# Patient Record
Sex: Female | Born: 1980 | Race: Black or African American | Hispanic: No | Marital: Married | Smoking: Former smoker
Health system: Southern US, Community
[De-identification: ages and names within clinical notes are randomized; demographics above are authoritative.]

## PROBLEM LIST (undated history)

## (undated) DIAGNOSIS — R569 Unspecified convulsions: Secondary | ICD-10-CM

---

## 2006-05-05 ENCOUNTER — Emergency Department (HOSPITAL_COMMUNITY): Admission: EM | Admit: 2006-05-05 | Discharge: 2006-05-05 | Payer: Self-pay | Admitting: Emergency Medicine

## 2007-09-13 IMAGING — CT CT HEAD W/O CM
1 of 2 series · 13 of 30 positions shown, 17 images · non-contrast
Comparison: none

CLINICAL DATA: Seizure.  Headache.  Vomiting.  
 HEAD CT WITHOUT CONTRAST ([DATE] HOURS):
TECHNIQUE: Contiguous axial images were obtained from the base of the skull through the vertex according to standard protocol without contrast.

[Series 2: brain · axial · 0.47mm/px · z∈[+112,+232]mm · 13 of 28 slices shown, 17 images]
[im 2/28  brain]
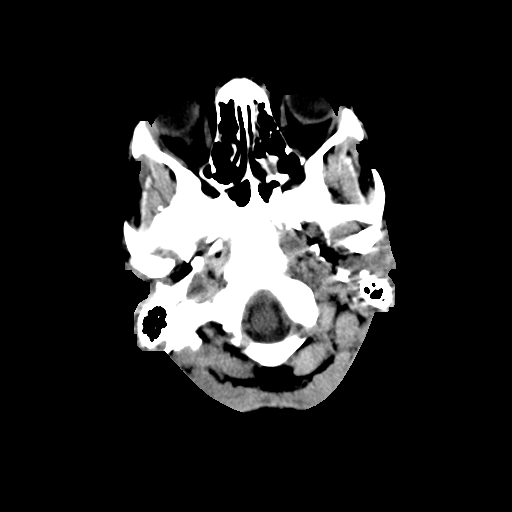
[im 2/28  bone]
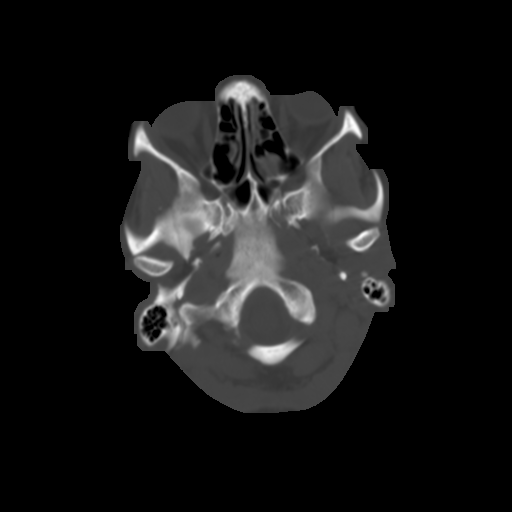
[im 4/28  brain]
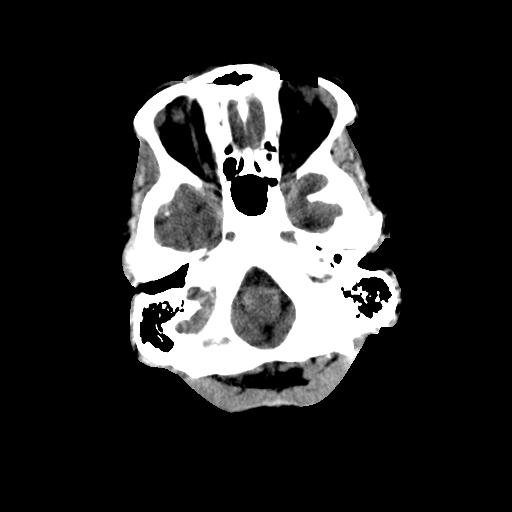
[im 6/28  brain]
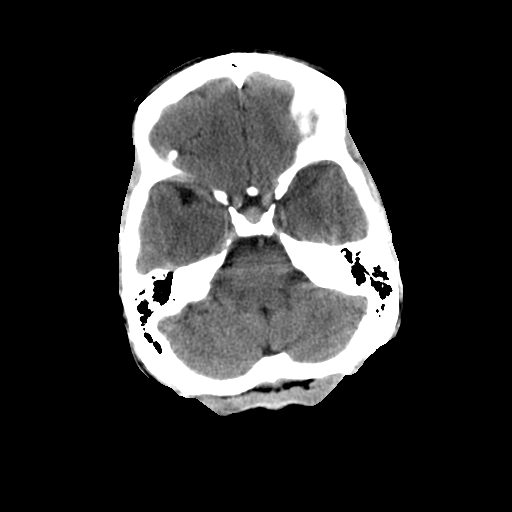
[im 8/28  brain]
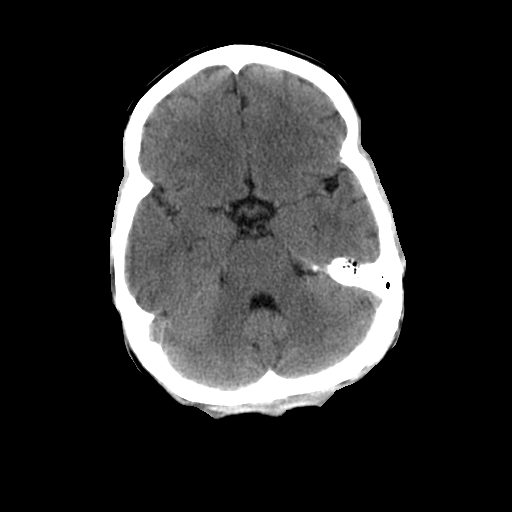
[im 10/28  brain]
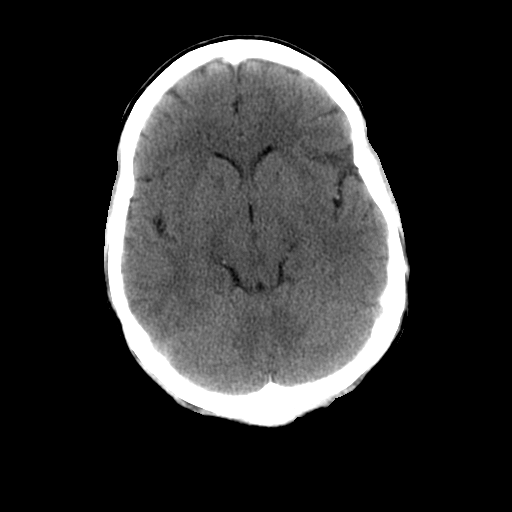
[im 10/28  bone]
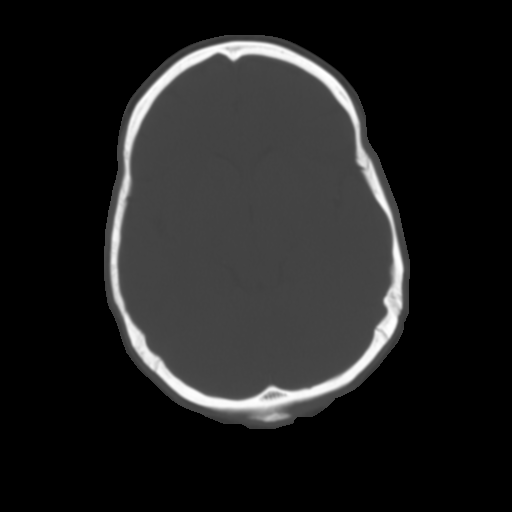
[im 12/28  brain]
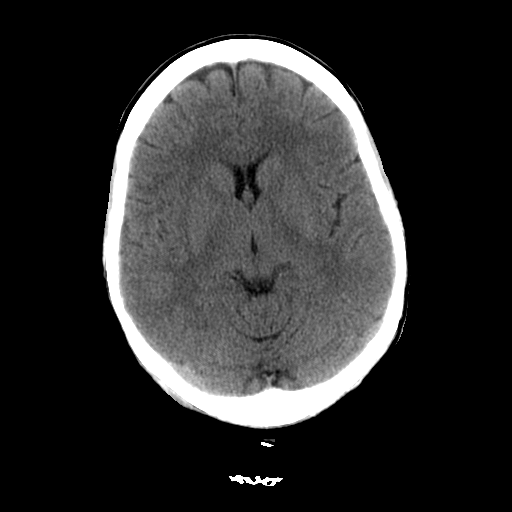
[im 14/28  brain]
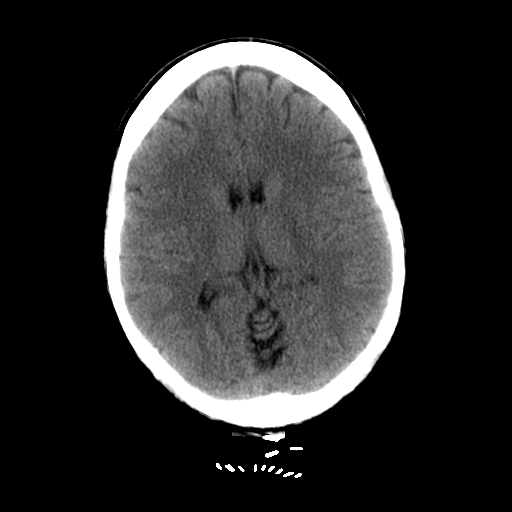
[im 16/28  brain]
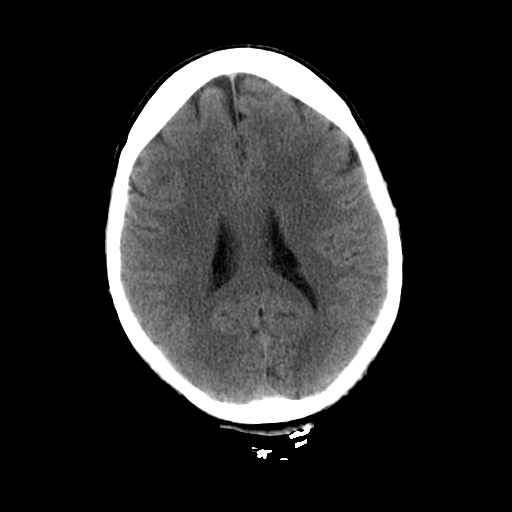
[im 18/28  brain]
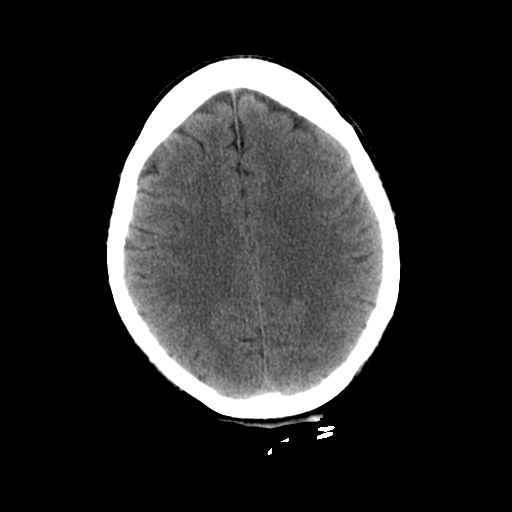
[im 18/28  bone]
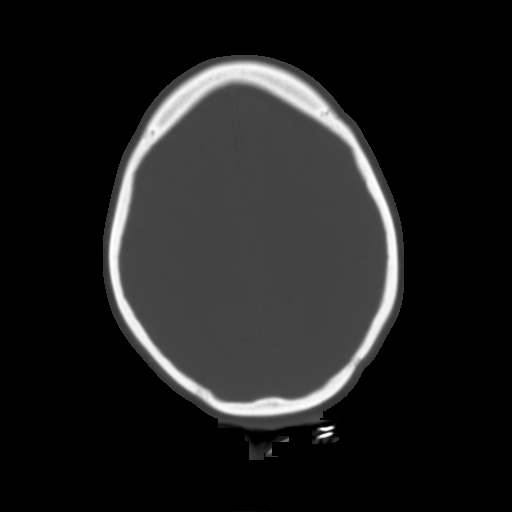
[im 20/28  brain]
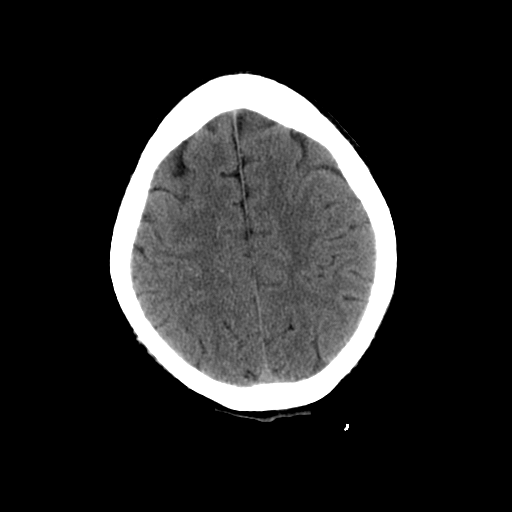
[im 22/28  brain]
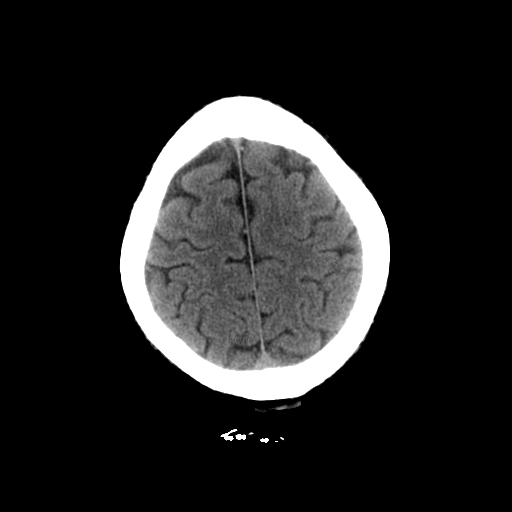
[im 24/28  brain]
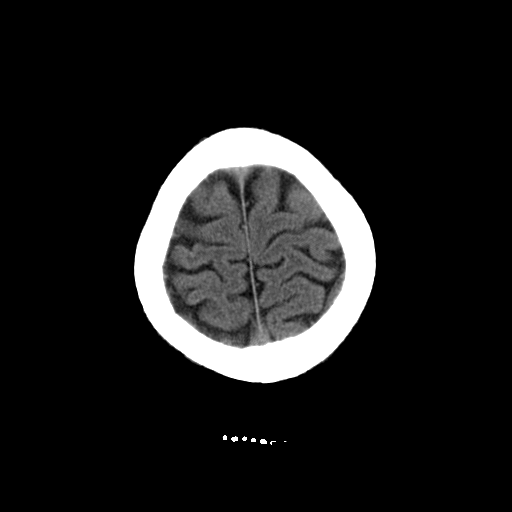
[im 26/28  brain]
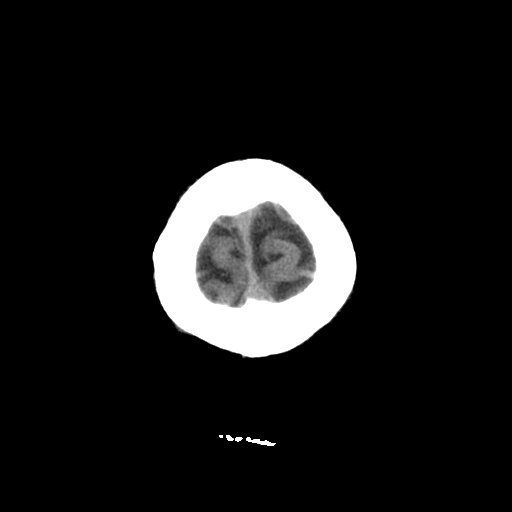
[im 26/28  bone]
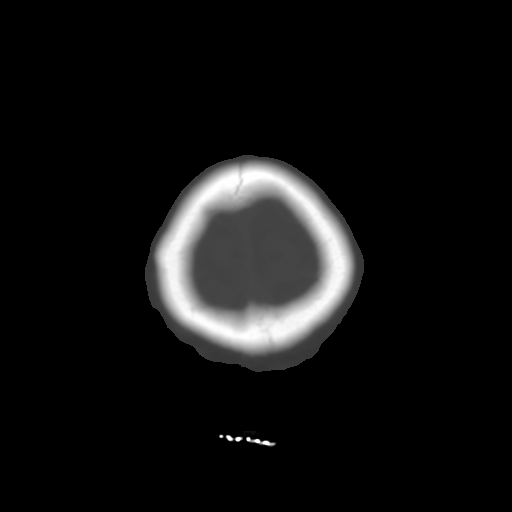

[13 of 30 positions shown; findings below may reference images not displayed]

FINDINGS: There is no mass effect, midline shift, or acute intracranial hemorrhage.  The brain parenchyma, ventricular system, and extraaxial space are within normal limits.  The skull is intact.  Opacification of the left posterior ethmoid air cell is noted.  Mastoid air cells are clear.
IMPRESSION: Opacification of the left posterior ethmoid air cell.  Otherwise no acute intracranial pathology.

## 2017-07-12 ENCOUNTER — Encounter (HOSPITAL_COMMUNITY): Payer: Self-pay | Admitting: *Deleted

## 2017-07-12 ENCOUNTER — Emergency Department (HOSPITAL_COMMUNITY)
Admission: EM | Admit: 2017-07-12 | Discharge: 2017-07-13 | Disposition: A | Discharge status: Home / Self Care | Payer: BLUE CROSS/BLUE SHIELD | Attending: Emergency Medicine | Admitting: Emergency Medicine

## 2017-07-12 DIAGNOSIS — I10 Essential (primary) hypertension: Secondary | ICD-10-CM

## 2017-07-12 DIAGNOSIS — Z79899 Other long term (current) drug therapy: Secondary | ICD-10-CM | POA: Diagnosis not present

## 2017-07-12 DIAGNOSIS — R51 Headache: Secondary | ICD-10-CM | POA: Insufficient documentation

## 2017-07-12 DIAGNOSIS — R030 Elevated blood-pressure reading, without diagnosis of hypertension: Secondary | ICD-10-CM | POA: Diagnosis present

## 2017-07-12 DIAGNOSIS — Z87891 Personal history of nicotine dependence: Secondary | ICD-10-CM | POA: Diagnosis not present

## 2017-07-12 HISTORY — DX: Unspecified convulsions: R56.9

## 2017-07-12 LAB — CBC WITH DIFFERENTIAL/PLATELET
Basophils Absolute: 0 10*3/uL (ref 0.0–0.1)
Basophils Relative: 0 %
EOS ABS: 0.2 10*3/uL (ref 0.0–0.7)
EOS PCT: 2 %
HCT: 46.6 % — ABNORMAL HIGH (ref 36.0–46.0)
Hemoglobin: 15.6 g/dL — ABNORMAL HIGH (ref 12.0–15.0)
LYMPHS ABS: 4.4 10*3/uL — AB (ref 0.7–4.0)
LYMPHS PCT: 39 %
MCH: 34 pg (ref 26.0–34.0)
MCHC: 33.5 g/dL (ref 30.0–36.0)
MCV: 101.5 fL — AB (ref 78.0–100.0)
MONO ABS: 0.7 10*3/uL (ref 0.1–1.0)
MONOS PCT: 6 %
Neutro Abs: 6 10*3/uL (ref 1.7–7.7)
Neutrophils Relative %: 53 %
PLATELETS: 332 10*3/uL (ref 150–400)
RBC: 4.59 MIL/uL (ref 3.87–5.11)
RDW: 12.8 % (ref 11.5–15.5)
WBC: 11.4 10*3/uL — ABNORMAL HIGH (ref 4.0–10.5)

## 2017-07-12 LAB — I-STAT TROPONIN, ED: TROPONIN I, POC: 0.01 ng/mL (ref 0.00–0.08)

## 2017-07-12 LAB — I-STAT BETA HCG BLOOD, ED (MC, WL, AP ONLY): I-stat hCG, quantitative: <5 m[IU]/mL (ref <5)

## 2017-07-12 NOTE — ED Provider Notes (Signed)
[facility] EMERGENCY DEPARTMENT Provider Note   CSN: [id number] Arrival date & time: 07/12/17  1819     History   Chief Complaint Chief Complaint  Patient presents with  . Hypertension    HPI Katie Adams is a 36 y.o. female.  HPI   Katie Adams is a 36 y.o. female, with a history of seizures, presenting to the ED with concern for high blood pressure. 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 States she has had high blood pressure readings in the past, has gone to urgent care in the past for this, and was prescribed norvasc. Does not have a PCP. Currently complains of a left frontal headache, he feels like a pressure, moderate, nonradiating, consistent with her previous headaches.  Has not taken any medications for her headache.  Denies dizziness, fever/chills, falls/trauma, neuro deficits, vision abnormalities, chest pain, shortness of breath, or any other complaints.      Past Medical History:  Diagnosis Date  . Seizures (HCC)     There are no active problems to display for this patient.   History reviewed. No pertinent surgical history.  OB History    No data available       Home Medications    Prior to Admission medications   Medication Sig Start Date End Date Taking? Authorizing Provider  aspirin-acetaminophen-caffeine (EXCEDRIN MIGRAINE) [phone] MG tablet Take 2 tablets by mouth every 6 (six) hours as needed for headache.   Yes [provider]  fexofenadine (ALLEGRA) 180 MG tablet Take 180 mg by mouth daily.   Yes [provider]  lamoTRIgine (LAMICTAL) 100 MG tablet Take 100 mg by mouth 2 (two) times daily. 01/28/17  Yes [provider]    Family History No family history on file.  Social History Social History  Substance Use Topics  . Smoking status: Former Games developer  . Smokeless tobacco: Never Used  . Alcohol use Yes     Comment: social     Allergies     Amoxicillin and Keppra [levetiracetam]   Review of Systems Review of Systems  Constitutional: Negative for chills, diaphoresis and fever.  Respiratory: Negative for shortness of breath.   Cardiovascular: Negative for chest pain.  Gastrointestinal: Negative for abdominal pain, nausea and vomiting.  Neurological: Positive for headaches. Negative for dizziness, syncope, weakness, light-headedness and numbness.  All other systems reviewed and are negative.   Physical Exam Updated Vital Signs BP (!) 122/107   Pulse 61   Temp 98.6 F (37 C) (Oral)   Resp 16   Ht 5\' 4"  (1.626 m)   Wt 95.3 kg (210 lb)   SpO2 100%   BMI 36.05 kg/m   Physical Exam  Constitutional: She is oriented to person, place, and time. She appears well-developed and well-nourished. No distress.  HENT:  Head: Normocephalic and atraumatic.  Mouth/Throat: Oropharynx is clear and moist.  Eyes: Pupils are equal, round, and reactive to light. Conjunctivae and EOM are normal.  Neck: Normal range of motion. Neck supple.  Cardiovascular: Normal rate, regular rhythm, normal heart sounds and intact distal pulses.   Pulmonary/Chest: Effort normal and breath sounds normal. No respiratory distress.  Abdominal: Soft. There is no tenderness. There is no guarding.  Musculoskeletal: She exhibits no edema.  Lymphadenopathy:    She has no cervical adenopathy.  Neurological: She is alert and oriented to person, place, and time.  No sensory deficits.  No noted speech deficits. No aphasia.  Patient handles oral secretions without difficulty. No noted swallowing defects.  Equal grip strength bilaterally. Strength 5/5 in the upper extremities. Strength 5/5 with flexion and extension of the hips, knees, and ankles bilaterally.  Patellar DTRs 2+ bilaterally Negative Romberg. No gait disturbance.  Coordination intact including heel to shin and finger to nose.  Cranial nerves III-XII grossly intact.  No facial droop.   Skin: Skin  is warm and dry. Capillary refill takes less than 2 seconds. She is not diaphoretic.  Psychiatric: She has a normal mood and affect. Her behavior is normal.  Nursing note and vitals reviewed.    ED Treatments / Results  Labs (all labs ordered are listed, but only abnormal results are displayed) Labs Reviewed  COMPREHENSIVE METABOLIC PANEL - Abnormal; Notable for the following:       Result Value   BUN <5 (*)    All other components within normal limits  CBC WITH DIFFERENTIAL/PLATELET - Abnormal; Notable for the following:    WBC 11.4 (*)    Hemoglobin 15.6 (*)    HCT 46.6 (*)    MCV 101.5 (*)    Lymphs Abs 4.4 (*)    All other components within normal limits  I-STAT TROPONIN, ED  I-STAT BETA HCG BLOOD, ED ([facility], [facility], [facility] ONLY)    EKG  EKG Interpretation  Date/Time:  Tuesday July 12 2017 23:39:33 EDT Ventricular Rate:  61 PR Interval:    QRS Duration: 101 QT Interval:  446 QTC Calculation: 450 R Axis:   53 Text Interpretation:  Sinus rhythm Biatrial enlargement Left ventricular hypertrophy No previous ECGs available Confirmed by Glynn Octaveancour, Stephen [phone]([id number]) on 07/13/2017 12:02:01 AM Also confirmed by Glynn Octaveancour, Stephen [phone]), editor Jac CanavanWatlington, Beverly ([id number])  on 07/13/2017 7:02:01 AM       Radiology No results found.  Procedures Procedures (including critical care time)  Medications Ordered in ED Medications - No data to display   Initial Impression / Assessment and Plan / ED Course  I have reviewed the triage vital signs and the nursing notes.  Pertinent labs & imaging results that were available during my care of the patient were reviewed by me and considered in my medical decision making (see chart for details).     Patient presents with concern for high blood pressure.  Patient's headache spontaneously resolved over ED course.  BP spontaneously lowered with reassurance and did not resurge.  PCP follow-up recommended.  Resources given.  The patient was given  instructions for home care as well as return precautions. Patient voices understanding of these instructions, accepts the plan, and is comfortable with discharge.   Vitals:   07/13/17 0045 07/13/17 0100 07/13/17 0115 07/13/17 0130  BP: 127/90 123/90 129/78 126/89  Pulse: (!) 51 71 (!) 52 (!) 54  Resp: 16 17 18 16   Temp:      TempSrc:      SpO2: 98% 100% 99% 100%  Weight:      Height:         Final Clinical Impressions(s) / ED Diagnoses   Final diagnoses:  Hypertension, unspecified type    New Prescriptions Discharge Medication List as of 07/13/2017  1:29 AM       Anselm PancoastJoy, Braydn Carneiro C, PA-C 07/14/17 0238    Glynn Octaveancour, Stephen, MD 07/14/17 [id number]

## 2017-07-12 NOTE — ED Triage Notes (Signed)
To ED for eval of HTN. States she had her bp taken at school and found to be 205/105. States she's had a HA for the past week. Hx of seizures but ran out of her meds 3 days ago. No local doctor.

## 2017-07-13 LAB — COMPREHENSIVE METABOLIC PANEL
ALBUMIN: 4.3 g/dL (ref 3.5–5.0)
ALT: 17 U/L (ref 14–54)
ANION GAP: 7 (ref 5–15)
AST: 21 U/L (ref 15–41)
Alkaline Phosphatase: 82 U/L (ref 38–126)
BILIRUBIN TOTAL: 0.6 mg/dL (ref 0.3–1.2)
CHLORIDE: 108 mmol/L (ref 101–111)
CO2: 24 mmol/L (ref 22–32)
Calcium: 9 mg/dL (ref 8.9–10.3)
Creatinine, Ser: 0.92 mg/dL (ref 0.44–1.00)
GFR calc Af Amer: >60 mL/min (ref >60)
GFR calc non Af Amer: >60 mL/min (ref >60)
GLUCOSE: 92 mg/dL (ref 65–99)
POTASSIUM: 3.7 mmol/L (ref 3.5–5.1)
SODIUM: 139 mmol/L (ref 135–145)
TOTAL PROTEIN: 6.9 g/dL (ref 6.5–8.1)

## 2017-07-13 NOTE — ED Notes (Signed)
Pt ambulated w/o assistance. Pt has steady gait and no complaints.

## 2017-07-13 NOTE — Discharge Instructions (Signed)
Your blood pressure resolved to normal level.  Please follow-up with a primary care provider on this matter.  Return to the ED as needed.
